# Patient Record
Sex: Female | Born: 1960
Health system: Southern US, Community
[De-identification: ages and names within clinical notes are randomized; demographics above are authoritative.]

## PROBLEM LIST (undated history)

## (undated) DIAGNOSIS — M1990 Unspecified osteoarthritis, unspecified site: Secondary | ICD-10-CM

## (undated) DIAGNOSIS — Z8614 Personal history of Methicillin resistant Staphylococcus aureus infection: Secondary | ICD-10-CM

## (undated) DIAGNOSIS — J40 Bronchitis, not specified as acute or chronic: Secondary | ICD-10-CM

## (undated) DIAGNOSIS — R519 Headache, unspecified: Secondary | ICD-10-CM

## (undated) DIAGNOSIS — R51 Headache: Secondary | ICD-10-CM

## (undated) DIAGNOSIS — I10 Essential (primary) hypertension: Secondary | ICD-10-CM

## (undated) DIAGNOSIS — K219 Gastro-esophageal reflux disease without esophagitis: Secondary | ICD-10-CM

## (undated) HISTORY — PX: TONSILLECTOMY: SUR1361

## (undated) HISTORY — PX: CHOLECYSTECTOMY: SHX55

---

## 2004-12-23 ENCOUNTER — Emergency Department: Payer: Self-pay | Admitting: Emergency Medicine

## 2005-01-09 ENCOUNTER — Emergency Department: Payer: Self-pay | Admitting: Emergency Medicine

## 2005-03-13 ENCOUNTER — Emergency Department: Payer: Self-pay | Admitting: Emergency Medicine

## 2005-04-05 ENCOUNTER — Emergency Department: Payer: Self-pay | Admitting: Emergency Medicine

## 2005-06-03 ENCOUNTER — Emergency Department: Payer: Self-pay | Admitting: Unknown Physician Specialty

## 2005-07-30 ENCOUNTER — Emergency Department: Payer: Self-pay | Admitting: Emergency Medicine

## 2005-07-31 ENCOUNTER — Ambulatory Visit: Payer: Self-pay | Admitting: Emergency Medicine

## 2005-12-10 ENCOUNTER — Emergency Department: Payer: Self-pay | Admitting: Emergency Medicine

## 2005-12-27 ENCOUNTER — Emergency Department: Payer: Self-pay | Admitting: Emergency Medicine

## 2006-02-14 ENCOUNTER — Emergency Department: Payer: Self-pay | Admitting: Internal Medicine

## 2007-09-13 ENCOUNTER — Emergency Department: Payer: Self-pay | Admitting: Emergency Medicine

## 2008-03-09 ENCOUNTER — Other Ambulatory Visit: Payer: Self-pay

## 2008-03-09 ENCOUNTER — Emergency Department: Payer: Self-pay | Admitting: Emergency Medicine

## 2008-09-22 IMAGING — CR DG ANKLE COMPLETE 3+V*L*
1 series · 4 of 4 positions shown · non-contrast
Comparison: none

REASON FOR EXAM: injury
COMMENTS:   LMP: Post-Menopausal

[Series 1: view not recorded · 0.17mm/px · 4 of 4 slices shown]
[im 1/4]
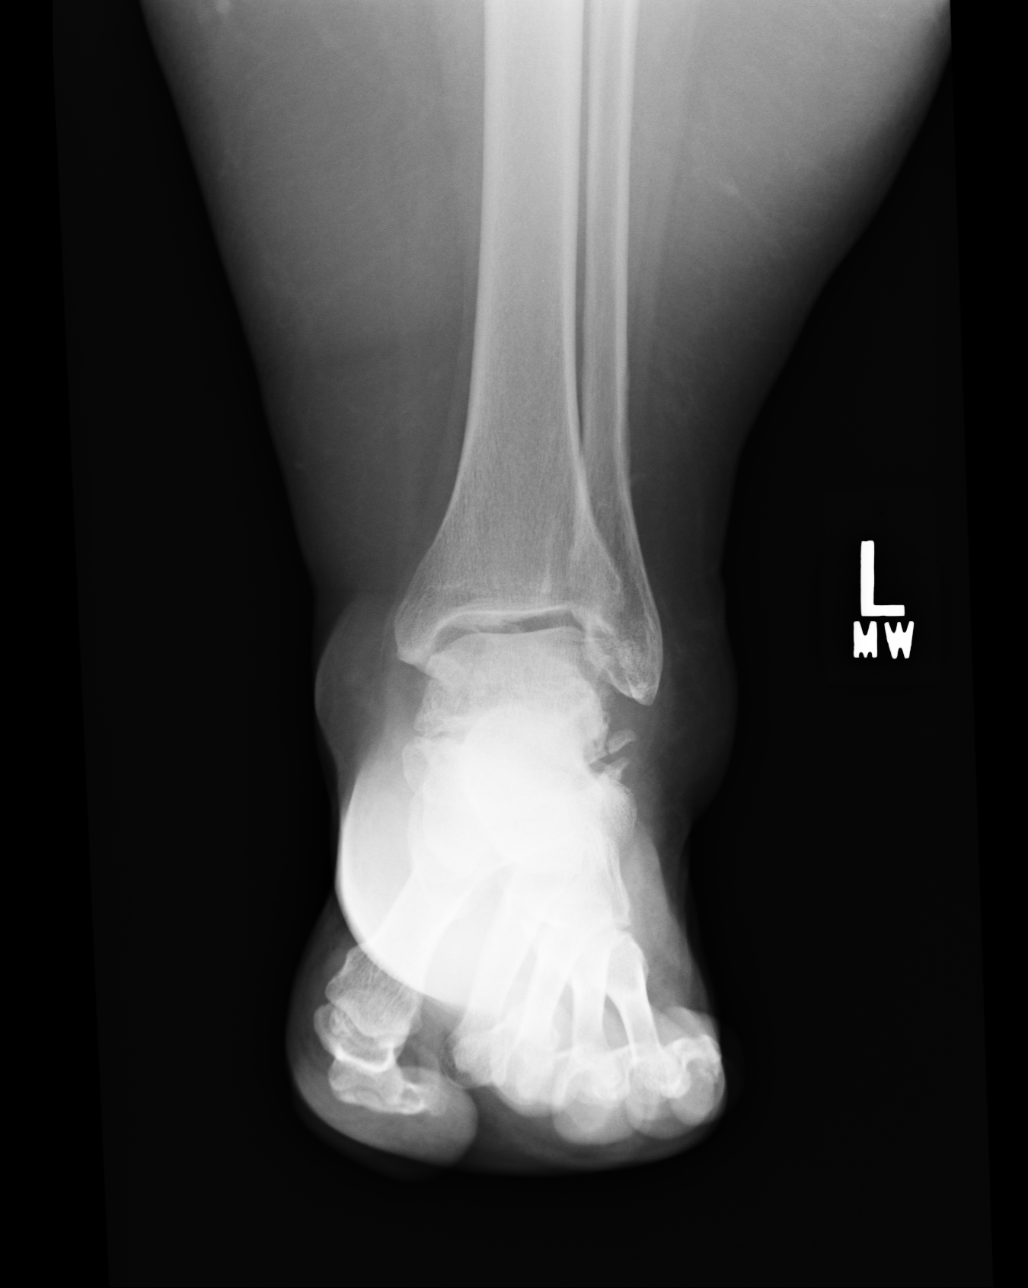
[im 2/4]
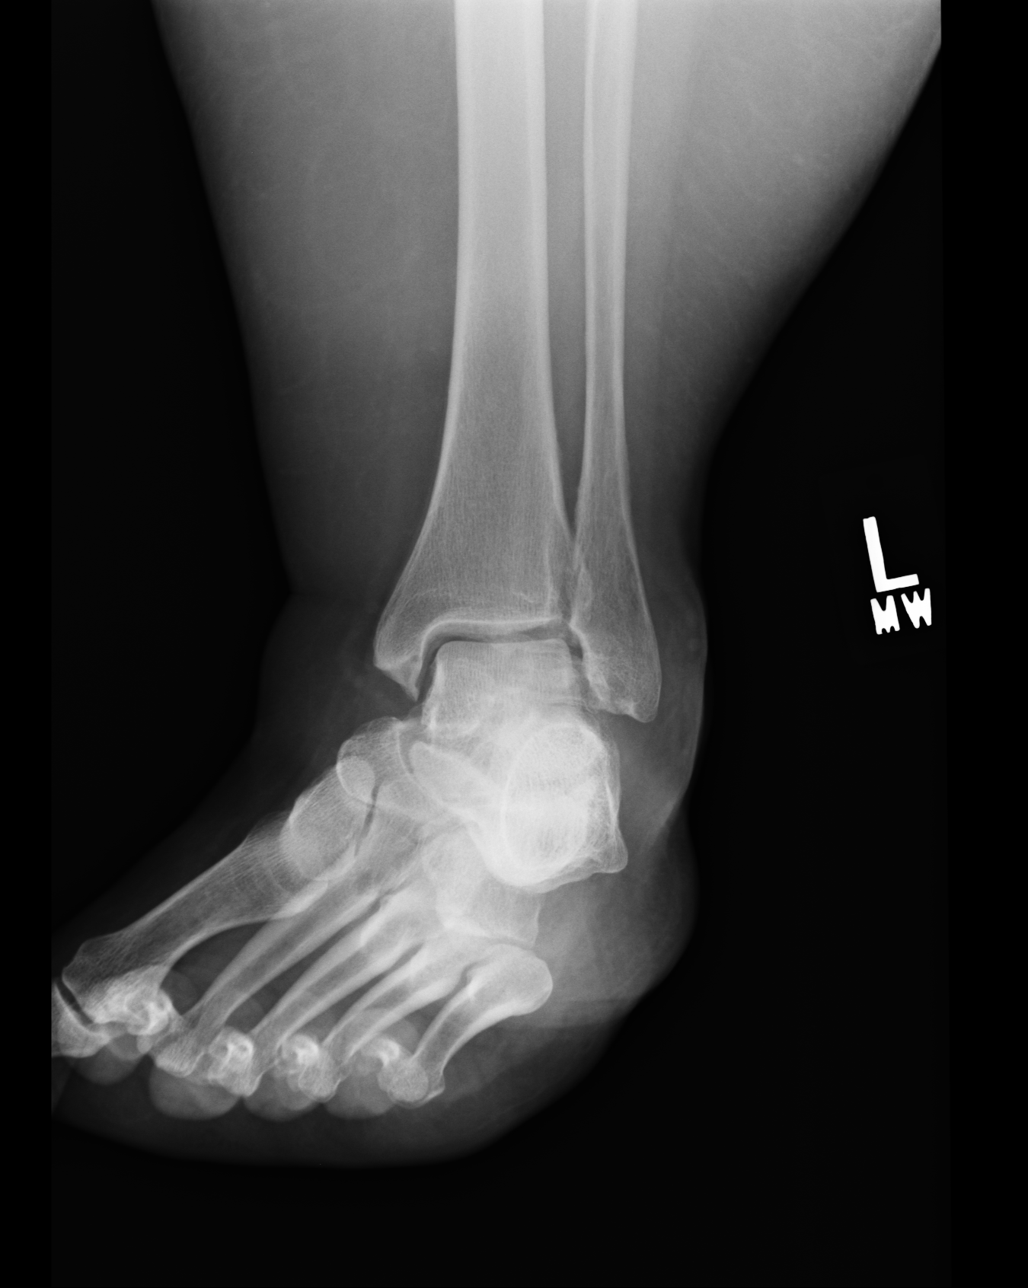
[im 3/4]
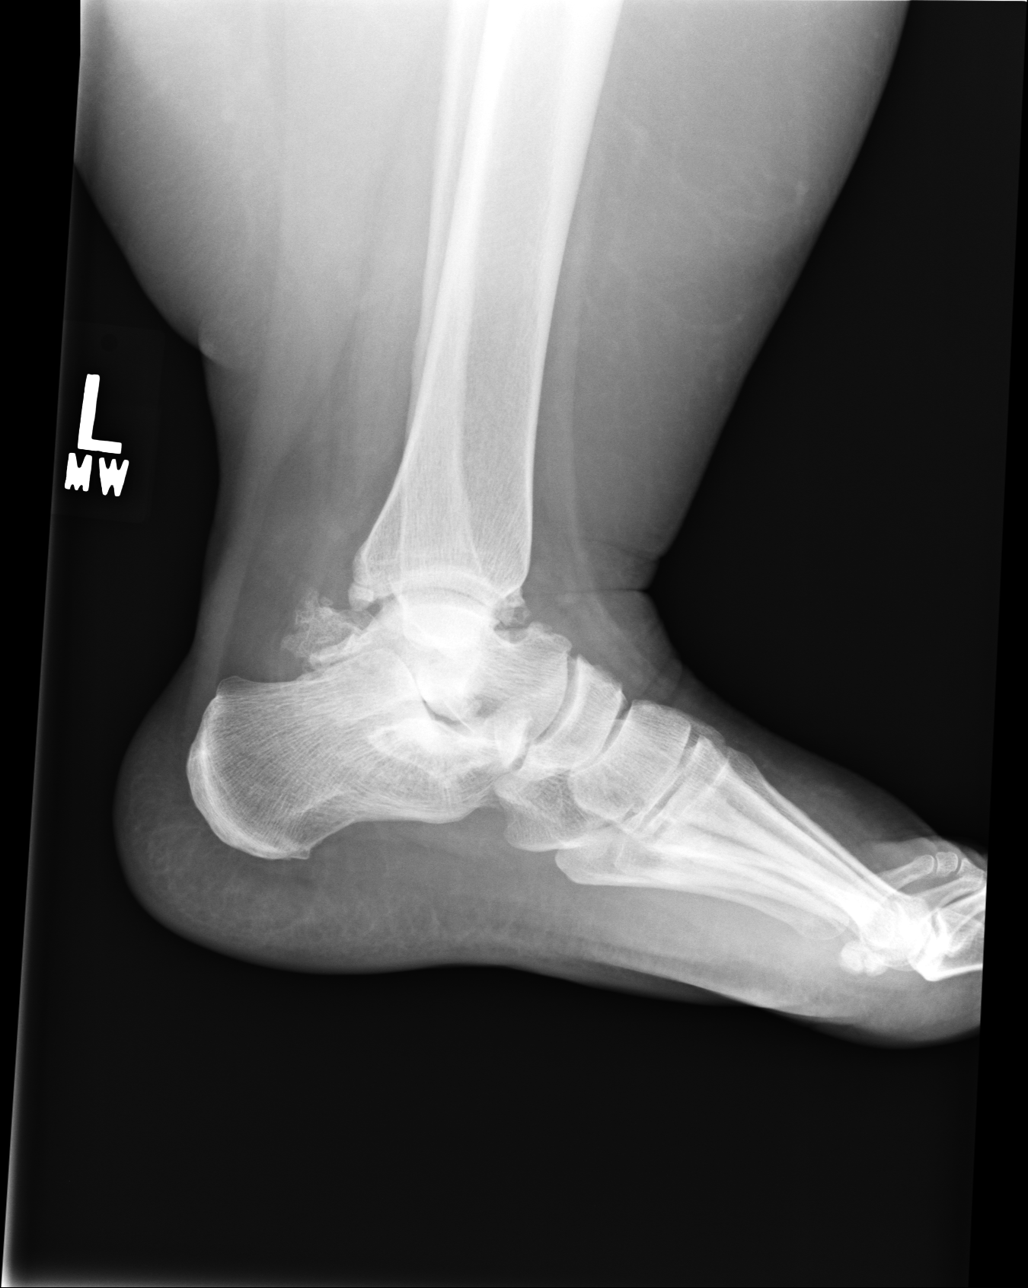
[im 4/4]
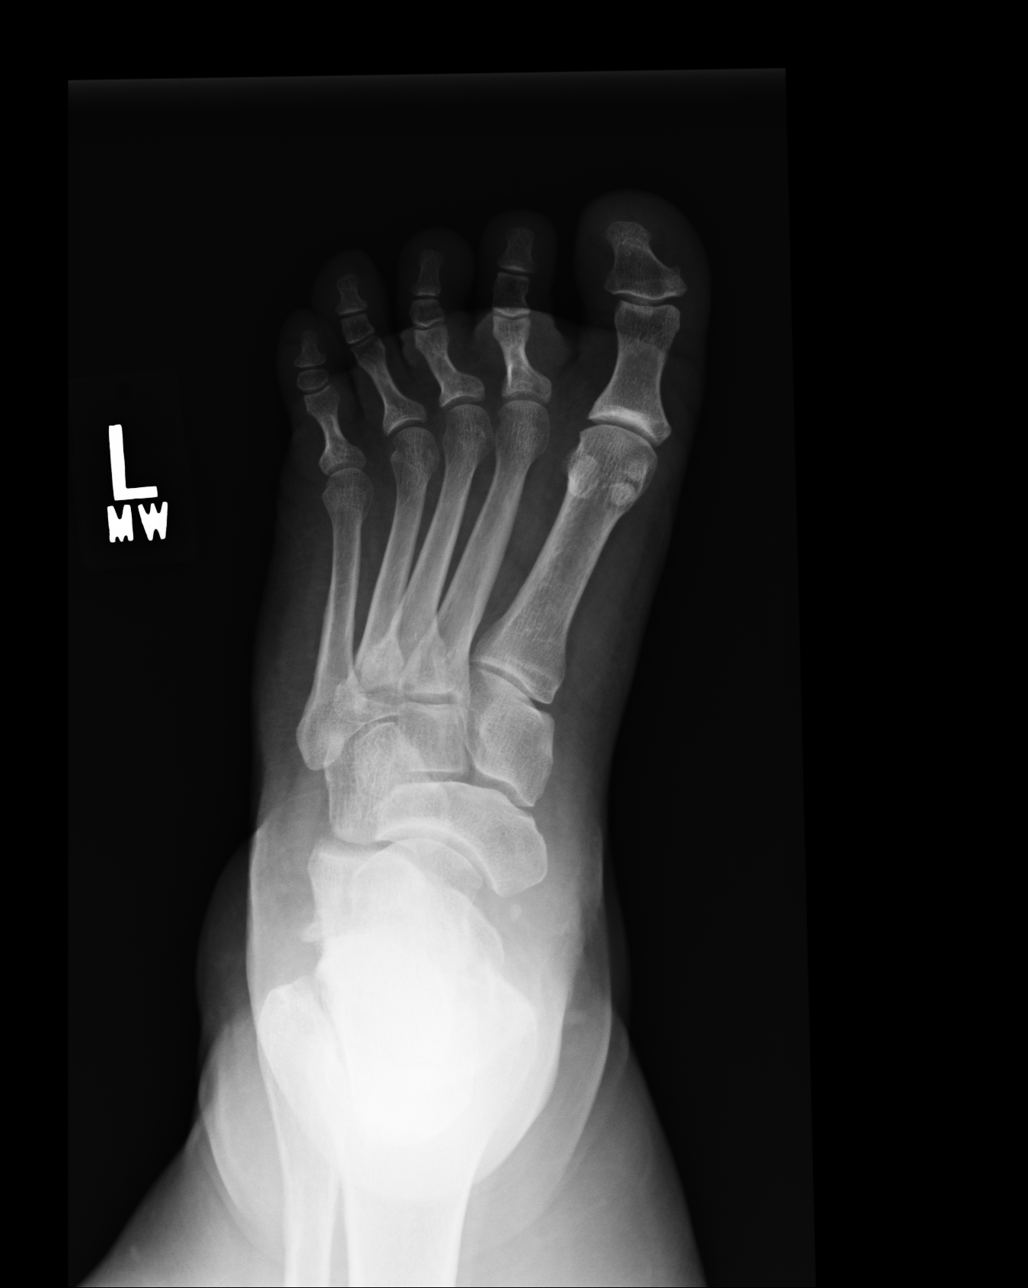

[4 of 4 positions shown; findings below may reference images not displayed]

PROCEDURE:     DXR - DXR ANKLE LEFT COMPLETE  - September 13, 2007  [DATE]

RESULT:     No acute fracture is identified. There is an osseous density
lateral to the inferior aspect of the talus consistent with a sesamoid bone
or an old chip fracture. Calcification is noted posterior to the ankle joint
and consistent with soft tissue calcification. There is irregularity of the
posterior malleolus of the distal tibia compatible with residual change from
prior trauma. Anterior spur formation is noted off the anterior aspect of
the tibia. The ankle mortise is well maintained.
IMPRESSION: 1.     No acute fracture is identified.

## 2009-07-05 ENCOUNTER — Ambulatory Visit: Payer: Self-pay | Admitting: Internal Medicine

## 2010-08-30 ENCOUNTER — Emergency Department: Payer: Self-pay | Admitting: Emergency Medicine

## 2010-12-20 DIAGNOSIS — Z8614 Personal history of Methicillin resistant Staphylococcus aureus infection: Secondary | ICD-10-CM

## 2010-12-20 HISTORY — DX: Personal history of Methicillin resistant Staphylococcus aureus infection: Z86.14

## 2011-02-09 ENCOUNTER — Ambulatory Visit: Payer: Self-pay | Admitting: Internal Medicine

## 2011-09-09 IMAGING — CR RIGHT FOOT - 2 VIEW
1 series · 2 of 2 positions shown · non-contrast
Comparison: none

REASON FOR EXAM: pain
COMMENTS:

[Series 1: view not recorded · 0.17mm/px · 2 of 2 slices shown]
[im 1/2]
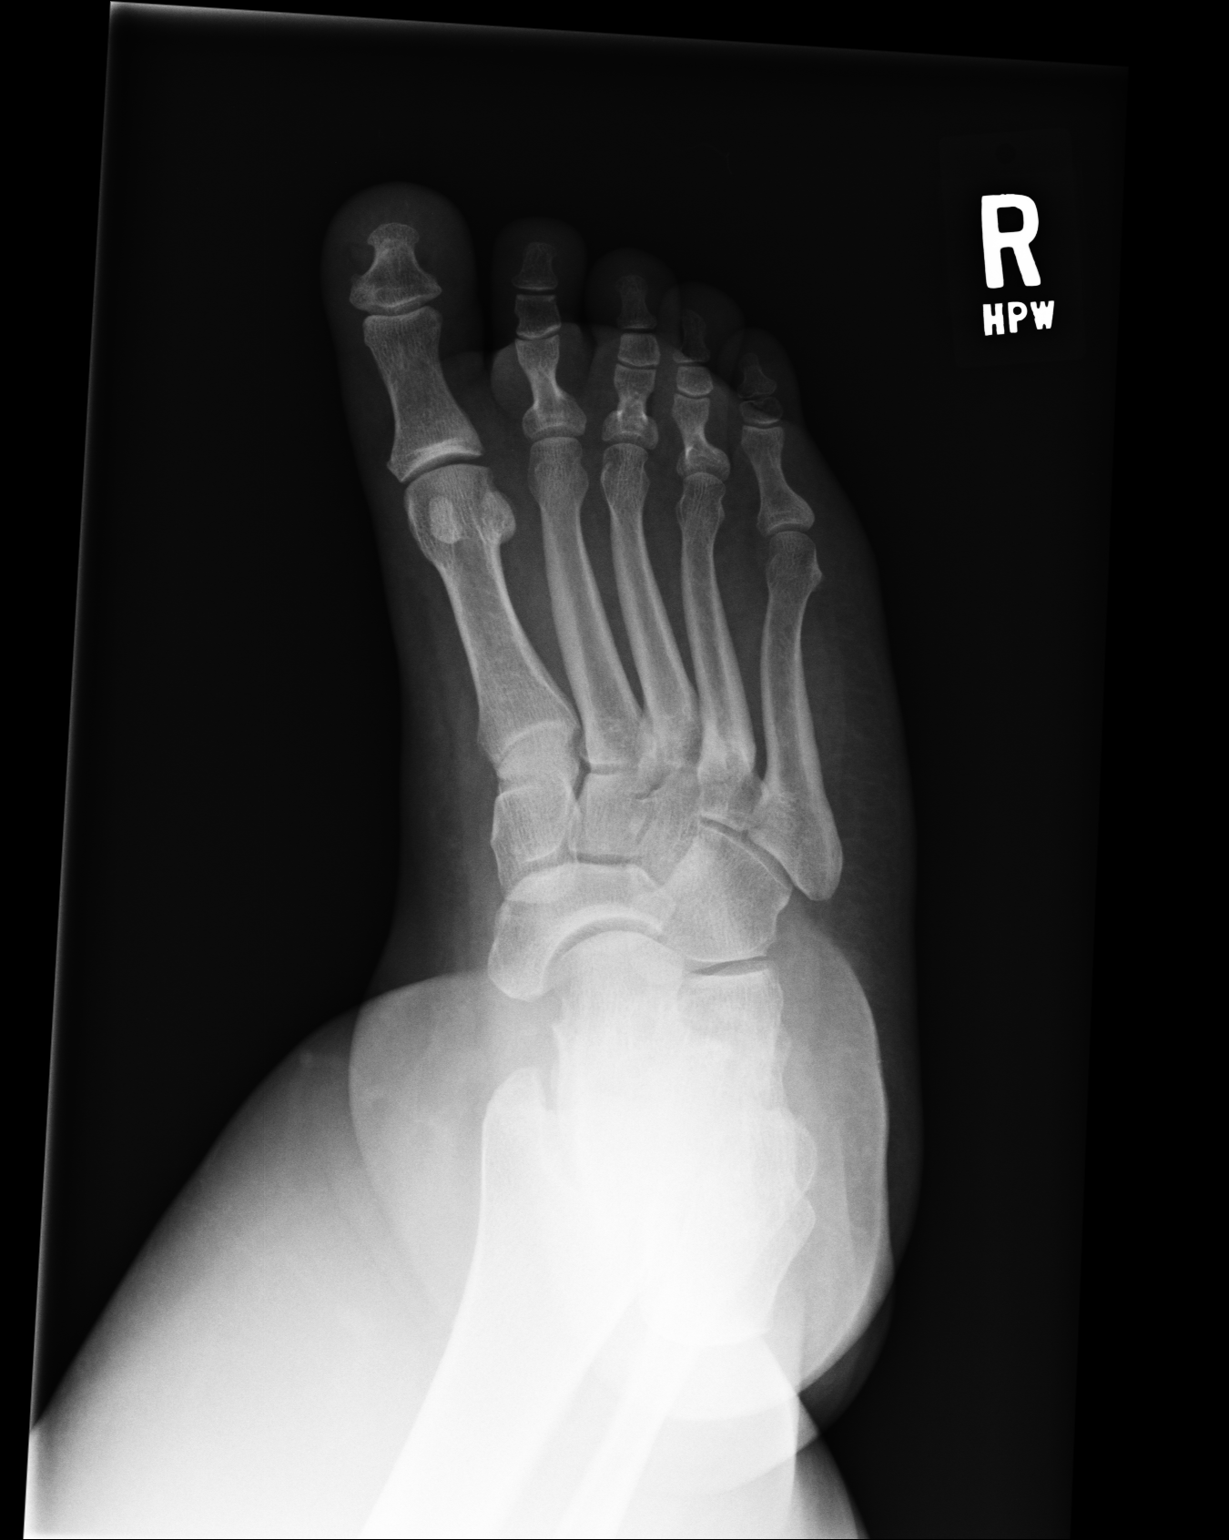
[im 2/2]
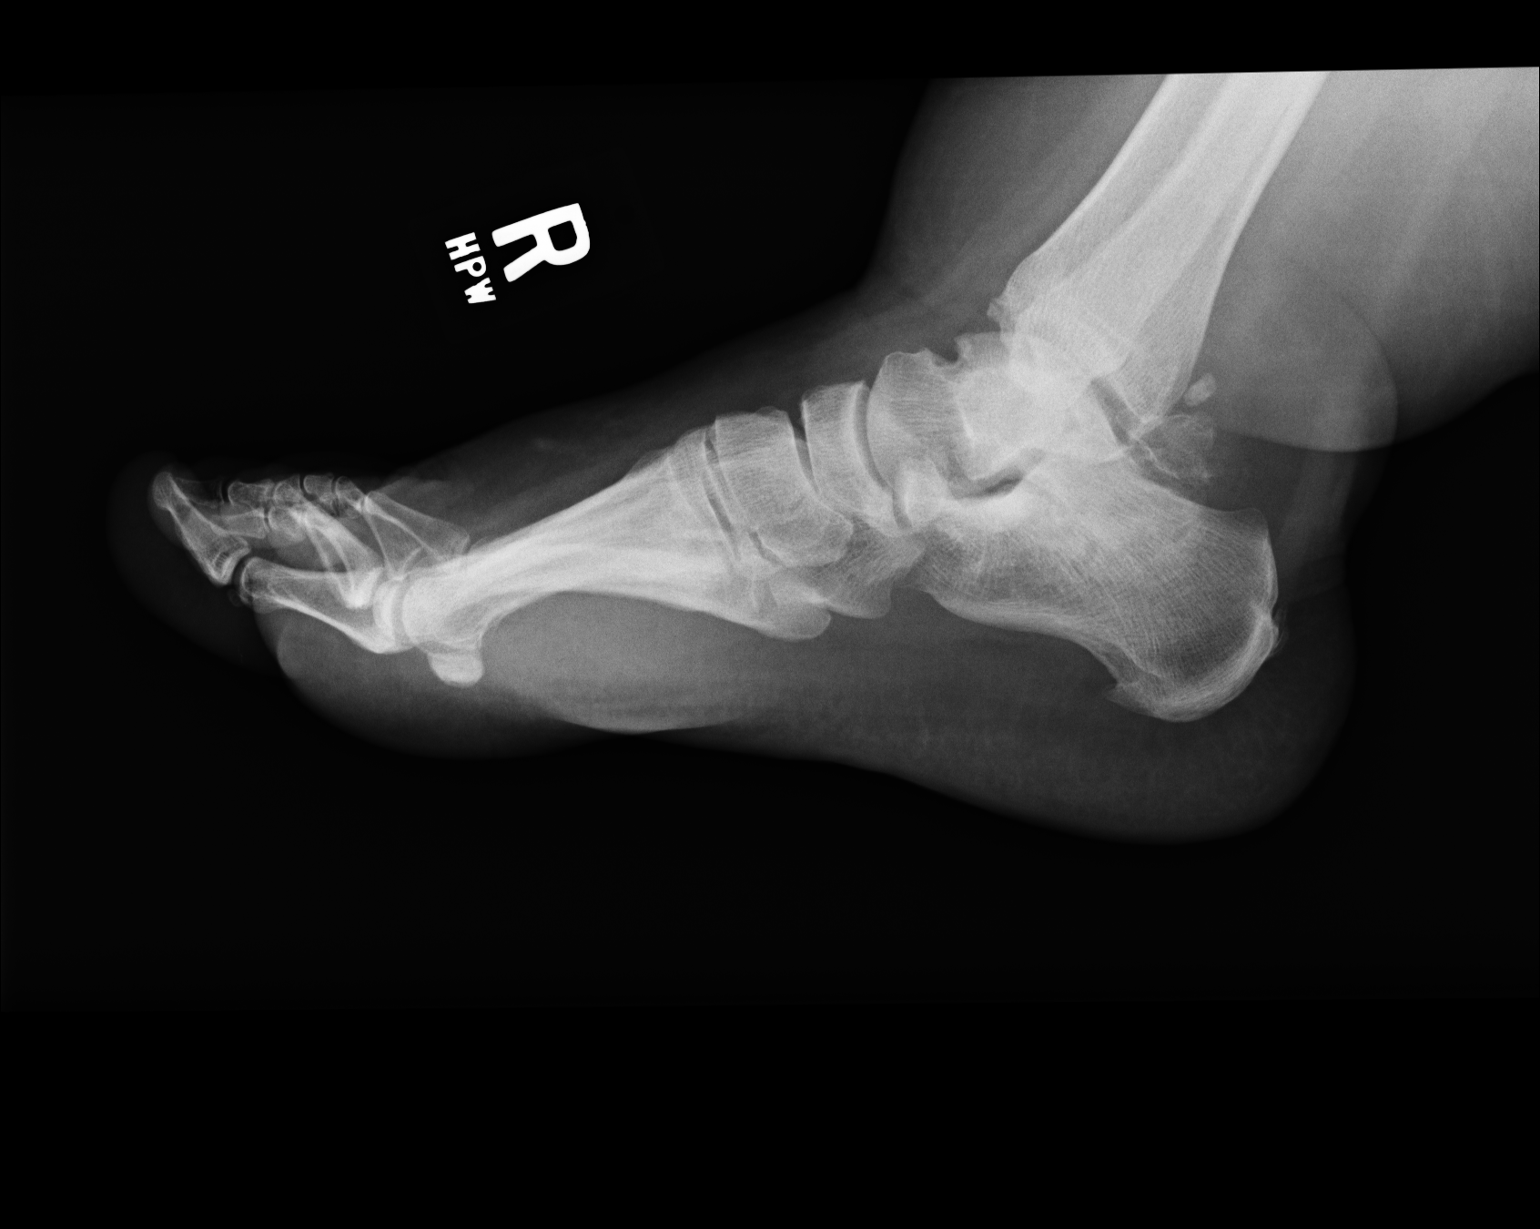

[2 of 2 positions shown; findings below may reference images not displayed]

PROCEDURE:     DXR - DXR FOOT RIGHT AP AND LATERAL  - August 30, 2010  [DATE]

RESULT:     AP and lateral views of the right foot reveal the bones to be
reasonably well mineralized. The phalanges and metatarsals appear intact.
There are chronic changes associated with the posterior aspect of the
tibiotalar joint which may be related to prior injury. I do not see evidence
of an acute fracture. There are Achilles region and plantar calcaneal spurs.
There does appear appear to be some swelling over the midfoot.
IMPRESSION: I do not see objective evidence of an acute fracture nor
healing fracture of the right foot. There are chronic changes associated
with the hindfoot. There is soft tissue swelling over the midfoot. Followup
CT imaging is available if there are strong clinical concerns of an occult
fracture. This is a limited two-view series.

## 2014-05-25 ENCOUNTER — Emergency Department: Payer: Self-pay | Admitting: Emergency Medicine

## 2015-06-04 IMAGING — CR DG FOOT COMPLETE 3+V*L*
1 series · 3 of 3 positions shown · non-contrast
Comparison: None.

CLINICAL DATA: Left foot pain and swelling.

EXAM:
LEFT FOOT - COMPLETE 3+ VIEW

[Series 1: x foot ap left · 0.14mm/px · 3 of 3 slices shown]
[im 1/3]
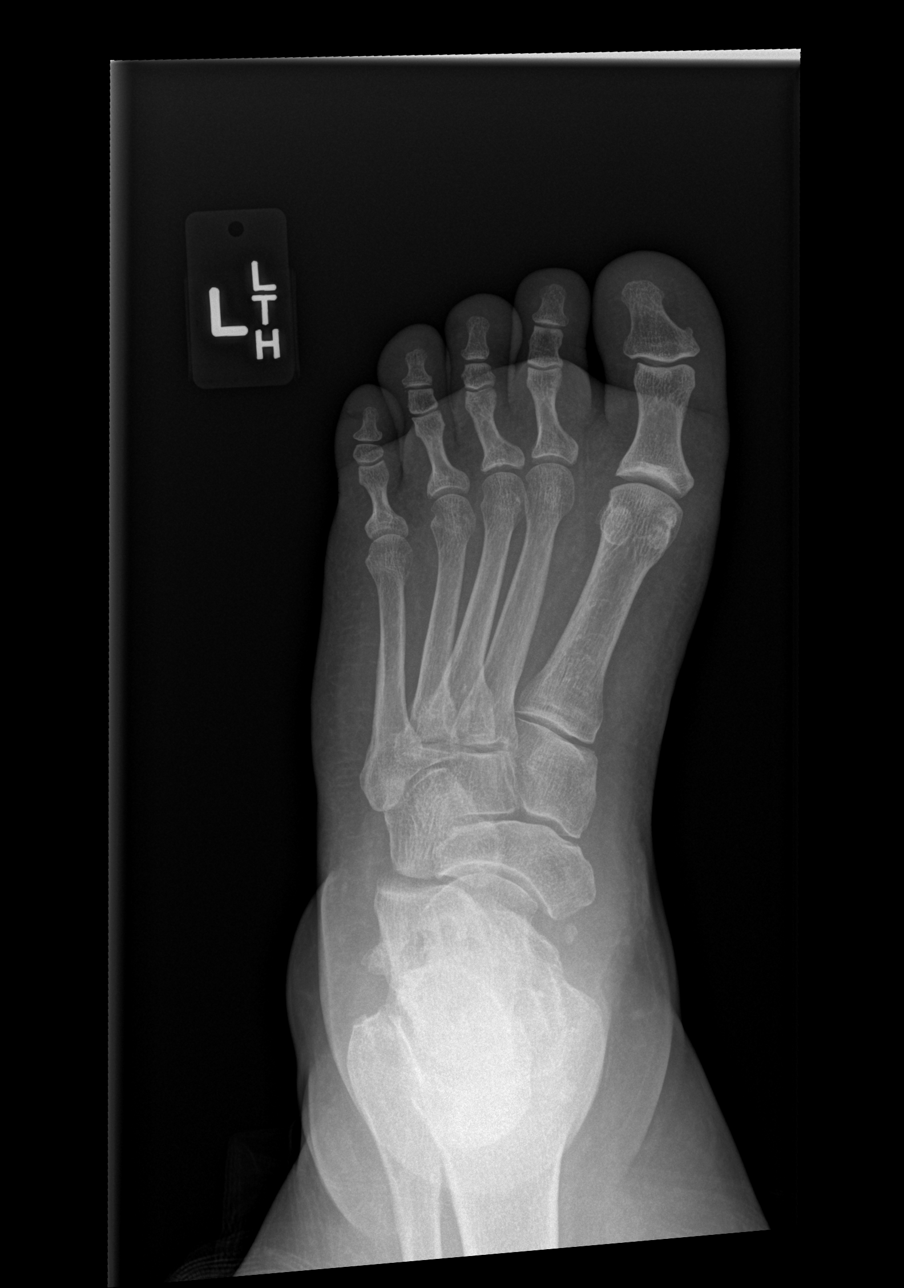
[im 2/3]
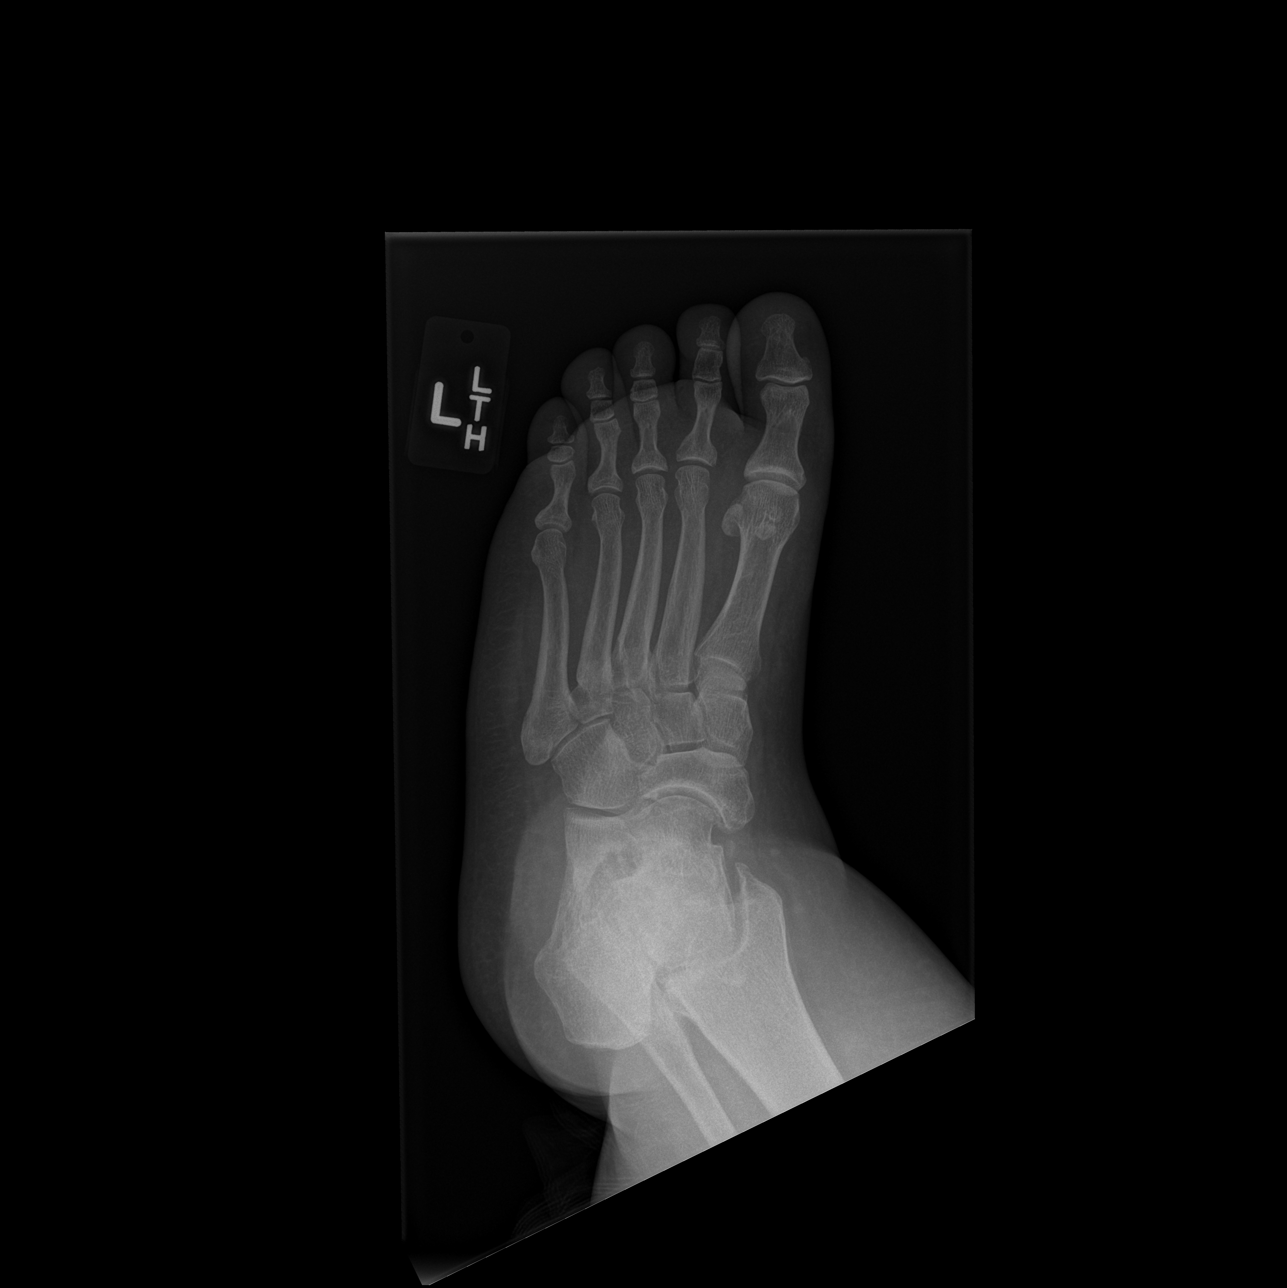
[im 3/3]
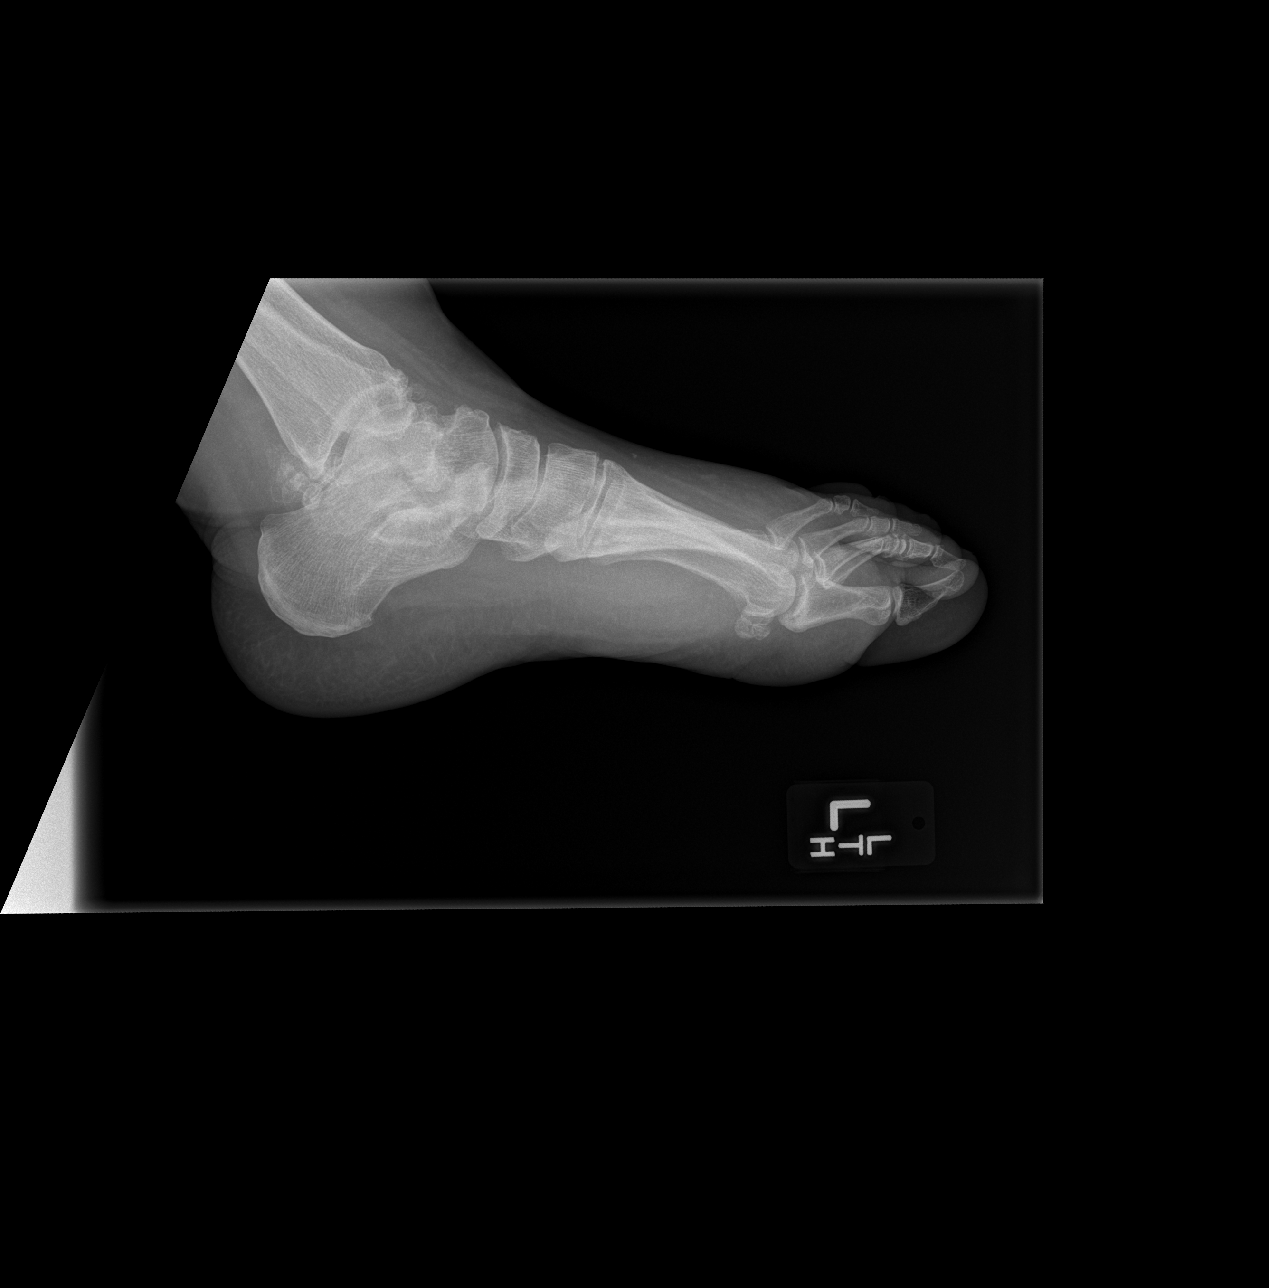

[3 of 3 positions shown; findings below may reference images not displayed]

FINDINGS: There is no evidence of fracture or dislocation. There is no
evidence of arthropathy or other focal bone abnormality in the foot.
Ankle joint osteoarthritis noted. Soft tissues are unremarkable.
IMPRESSION: No acute findings.

Ankle joint osteoarthritis noted.

## 2015-11-28 ENCOUNTER — Other Ambulatory Visit: Payer: Self-pay | Admitting: Internal Medicine

## 2015-11-28 DIAGNOSIS — Z1231 Encounter for screening mammogram for malignant neoplasm of breast: Secondary | ICD-10-CM

## 2015-12-11 ENCOUNTER — Ambulatory Visit: Payer: Self-pay | Attending: Internal Medicine

## 2016-01-19 ENCOUNTER — Emergency Department: Payer: Medicaid Other

## 2016-01-19 ENCOUNTER — Encounter: Payer: Self-pay | Admitting: Emergency Medicine

## 2016-01-19 ENCOUNTER — Emergency Department
Admission: EM | Admit: 2016-01-19 | Discharge: 2016-01-19 | Disposition: A | Discharge status: Home / Self Care | Payer: Medicaid Other | Attending: Emergency Medicine | Admitting: Emergency Medicine

## 2016-01-19 DIAGNOSIS — R079 Chest pain, unspecified: Secondary | ICD-10-CM | POA: Diagnosis not present

## 2016-01-19 DIAGNOSIS — I10 Essential (primary) hypertension: Secondary | ICD-10-CM | POA: Insufficient documentation

## 2016-01-19 DIAGNOSIS — F439 Reaction to severe stress, unspecified: Secondary | ICD-10-CM | POA: Insufficient documentation

## 2016-01-19 DIAGNOSIS — F419 Anxiety disorder, unspecified: Secondary | ICD-10-CM | POA: Diagnosis not present

## 2016-01-19 HISTORY — DX: Essential (primary) hypertension: I10

## 2016-01-19 LAB — TROPONIN I: TROPONIN I: 0.03 ng/mL (ref <0.031)

## 2016-01-19 LAB — BASIC METABOLIC PANEL
ANION GAP: 5 (ref 5–15)
BUN: 19 mg/dL (ref 6–20)
CHLORIDE: 101 mmol/L (ref 101–111)
CO2: 31 mmol/L (ref 22–32)
Calcium: 9 mg/dL (ref 8.9–10.3)
Creatinine, Ser: 0.72 mg/dL (ref 0.44–1.00)
GFR calc Af Amer: >60 mL/min (ref >60)
GFR calc non Af Amer: >60 mL/min (ref >60)
GLUCOSE: 108 mg/dL — AB (ref 65–99)
POTASSIUM: 3.9 mmol/L (ref 3.5–5.1)
Sodium: 137 mmol/L (ref 135–145)

## 2016-01-19 LAB — CBC
HEMATOCRIT: 42.8 % (ref 35.0–47.0)
HEMOGLOBIN: 14 g/dL (ref 12.0–16.0)
MCH: 27.5 pg (ref 26.0–34.0)
MCHC: 32.8 g/dL (ref 32.0–36.0)
MCV: 83.7 fL (ref 80.0–100.0)
Platelets: 263 10*3/uL (ref 150–440)
RBC: 5.11 MIL/uL (ref 3.80–5.20)
RDW: 14.3 % (ref 11.5–14.5)
WBC: 9.5 10*3/uL (ref 3.6–11.0)

## 2016-01-19 NOTE — ED Notes (Signed)
Pt had been having intermittent left sided cp since last night, occasionally shob, states she had a cardiac cath 6 months ago, no blockages at that time. Pt appears in no distress.

## 2016-01-19 NOTE — ED Notes (Signed)
MD Kinner at bedside  

## 2016-01-19 NOTE — ED Provider Notes (Signed)
[facility] Emergency Department Provider Note  ____________________________________________    I have reviewed the triage vital signs and the nursing notes.   HISTORY  Chief Complaint Chest Pain    HPI Anne Tucker is a 55 y.o. female who presents with complaints of intermittent left chest pain since last night. She reports it feels like a "pinching". She attributes this to stress that she is undergoing. No fevers or chills. She denies shortness of breath to me. She reports she was at [facility] 6 months ago and was cleared by the cardiologist and told her heart was normal. Currently she feels well and has no pain     Past Medical History  Diagnosis Date  . Hypertension     There are no active problems to display for this patient.   History reviewed. No pertinent past surgical history.  No current outpatient prescriptions on file.  Allergies Codeine  No family history on file.  Social History Social History  Substance Use Topics  . Smoking status: Never Smoker   . Smokeless tobacco: None  . Alcohol Use: No    Review of Systems  Constitutional: Negative for fever. Eyes: Negative for visual changes. ENT: Negative for sore throat Cardiovascular: As above Respiratory: Negative for shortness of breath. Gastrointestinal: Negative for abdominal pain, vomiting and diarrhea. Genitourinary: Negative for dysuria. Musculoskeletal: Negative for back pain. No calf pain or swelling Skin: Negative for rash. Neurological: Negative for headaches s Psychiatric: Positive for anxiety and stress    ____________________________________________   PHYSICAL EXAM:  VITAL SIGNS: ED Triage Vitals  Enc Vitals Group     BP 01/19/16 1532 125/72 mmHg     Pulse Rate 01/19/16 1532 91     Resp 01/19/16 1532 18     Temp 01/19/16 1532 98 F (36.7 C)     Temp Source 01/19/16 1532 Oral     SpO2 01/19/16 1532 98 %     Weight 01/19/16 1532 319 lb (144.697 kg)      Height 01/19/16 1532  (1.626 m)     Head Cir --      Peak Flow --      Pain Score 01/19/16 1539 5     Pain Loc --      Pain Edu? --      Excl. in GC? --      Constitutional: Alert and oriented. Well appearing and in no distress. Eyes: Conjunctivae are normal.  ENT   Head: Normocephalic and atraumatic.   Mouth/Throat: Mucous membranes are moist. Cardiovascular: Normal rate, regular rhythm. Normal and symmetric distal pulses are present in all extremities. No murmurs, rubs, or gallops. Respiratory: Normal respiratory effort without tachypnea nor retractions. Breath sounds are clear and equal bilaterally.  Gastrointestinal: Soft and non-tender in all quadrants. No distention. There is no CVA tenderness. Genitourinary: deferred Musculoskeletal: Nontender with normal range of motion in all extremities. No lower extremity tenderness nor edema. Neurologic:  Normal speech and language. No gross focal neurologic deficits are appreciated. Skin:  Skin is warm, dry and intact. No rash noted. Psychiatric: Mood and affect are normal. Patient exhibits appropriate insight and judgment.  ____________________________________________    LABS (pertinent positives/negatives)  Labs Reviewed  BASIC METABOLIC PANEL - Abnormal; Notable for the following:    Glucose, Bld 108 (*)    All other components within normal limits  CBC  TROPONIN I    ____________________________________________   EKG  ED ECG REPORT I, Jene Every, the attending physician, personally  viewed and interpreted this ECG.  Date: 01/19/2016 EKG Time: 3:28 PM Rate: 86 Rhythm: normal sinus rhythm QRS Axis: normal Intervals: normal ST/T Wave abnormalities: normal Conduction Disturbances: none Narrative Interpretation: unremarkable   ____________________________________________    RADIOLOGY I have personally reviewed any xrays that were ordered on this patient: Chest x-ray  unremarkable  ____________________________________________   PROCEDURES  Procedure(s) performed: none  Critical Care performed: none  ____________________________________________   INITIAL IMPRESSION / ASSESSMENT AND PLAN / ED COURSE  Pertinent labs & imaging results that were available during my care of the patient were reviewed by me and considered in my medical decision making (see chart for details).  Patient well-appearing and in no distress. Her EKG is unremarkable. Her troponin is normal. She is not having chest pain in the emergency department and has not had any pain for several hours. Apparently had a recent cardiac workup at [facility] which was unremarkable. Despite this I have requested that she follow-up with cardiology for further workup. She knows to return to the emergency department if she develops any chest pain  ____________________________________________   FINAL CLINICAL IMPRESSION(S) / ED DIAGNOSES  Final diagnoses:  Chest pain, unspecified chest pain type     Jene Every, MD 01/19/16 2142

## 2016-01-19 NOTE — Discharge Instructions (Signed)
Nonspecific Chest Pain °It is often hard to find the cause of chest pain. There is always a chance that your pain could be related to something serious, such as a heart attack or a blood clot in your lungs. Chest pain can also be caused by conditions that are not life-threatening. If you have chest pain, it is very important to follow up with your doctor. ° °HOME CARE °· If you were prescribed an antibiotic medicine, finish it all even if you start to feel better. °· Avoid any activities that cause chest pain. °· Do not use any tobacco products, including cigarettes, chewing tobacco, or electronic cigarettes. If you need help quitting, ask your doctor. °· Do not drink alcohol. °· Take medicines only as told by your doctor. °· Keep all follow-up visits as told by your doctor. This is important. This includes any further testing if your chest pain does not go away. °· Your doctor Evola tell you to keep your head raised (elevated) while you sleep. °· Make lifestyle changes as told by your doctor. These Lauricella include: °¨ Getting regular exercise. Ask your doctor to suggest some activities that are safe for you. °¨ Eating a heart-healthy diet. Your doctor or a diet specialist (dietitian) can help you to learn healthy eating options. °¨ Maintaining a healthy weight. °¨ Managing diabetes, if necessary. °¨ Reducing stress. °GET HELP IF: °· Your chest pain does not go away, even after treatment. °· You have a rash with blisters on your chest. °· You have a fever. °GET HELP RIGHT AWAY IF: °· Your chest pain is worse. °· You have an increasing cough, or you cough up blood. °· You have severe belly (abdominal) pain. °· You feel extremely weak. °· You pass out (faint). °· You have chills. °· You have sudden, unexplained chest discomfort. °· You have sudden, unexplained discomfort in your arms, back, neck, or jaw. °· You have shortness of breath at any time. °· You suddenly start to sweat, or your skin gets clammy. °· You feel  nauseous. °· You vomit. °· You suddenly feel light-headed or dizzy. °· Your heart begins to beat quickly, or it feels like it is skipping beats. °These symptoms Worrel be an emergency. Do not wait to see if the symptoms will go away. Get medical help right away. Call your local emergency services (911 in the [place] Do not drive yourself to the hospital. °  °This information is not intended to replace advice given to you by your health care provider. Make sure you discuss any questions you have with your health care provider. °  °Document Released: 05/24/2008 Document Revised: 12/27/2014 Document Reviewed: 07/12/2014 °Elsevier Interactive Patient Education ©2016 [employer] ° °

## 2016-10-20 ENCOUNTER — Other Ambulatory Visit: Payer: Self-pay | Admitting: Specialist

## 2016-10-22 ENCOUNTER — Encounter: Payer: Self-pay | Admitting: *Deleted

## 2016-10-22 ENCOUNTER — Encounter
Admission: RE | Admit: 2016-10-22 | Discharge: 2016-10-22 | Disposition: A | Discharge status: Home / Self Care | Payer: Medicaid Other | Source: Ambulatory Visit | Attending: Specialist | Admitting: Specialist

## 2016-10-22 HISTORY — DX: Headache, unspecified: R51.9

## 2016-10-22 HISTORY — DX: Headache: R51

## 2016-10-22 HISTORY — DX: Gastro-esophageal reflux disease without esophagitis: K21.9

## 2016-10-22 HISTORY — DX: Bronchitis, not specified as acute or chronic: J40

## 2016-10-22 HISTORY — DX: Unspecified osteoarthritis, unspecified site: M19.90

## 2016-10-22 NOTE — Patient Instructions (Addendum)
  Your procedure is scheduled on: 10-25-16  Report to Same Day Surgery 2nd floor medical mall To find out your arrival time please call [phone] between 1PM - 3PM on 10-22-16  Remember: Instructions that are not followed completely Milich result in serious medical risk, up to and including death, or upon the discretion of your surgeon and anesthesiologist your surgery Cervone need to be rescheduled.    _x___ 1. Do not eat food or drink liquids after midnight. No gum chewing or hard candies.     __x__ 2. No Alcohol for 24 hours before or after surgery.   __x__3. No Smoking for 24 prior to surgery.   ____  4. Bring all medications with you on the day of surgery if instructed.    __x__ 5. Notify your doctor if there is any change in your medical condition     (cold, fever, infections).     Do not wear jewelry, make-up, hairpins, clips or nail polish.  Do not wear lotions, powders, or perfumes. You Orrison wear deodorant.  Do not shave 48 hours prior to surgery. Men Costanzo shave face and neck.  Do not bring valuables to the hospital.    [facility] any belongings or valuables.               Contacts, dentures or bridgework Gielow not be worn into surgery.  Leave your suitcase in the car. After surgery it Carta be brought to your room.  For patients admitted to the hospital, discharge time is determined by your treatment team.   Patients discharged the day of surgery will not be allowed to drive home.    Please read over the following fact sheets that you were given:   [facility] Preparing [facility] and or MRSA Information   _x___ Take these medicines the morning of surgery with A SIP OF WATER:    1. LISINOPRIL  2. DULOXETINE  3. OMEPRAZOLE  4. TAKE AN EXTRA OMEPRAZOLE ON Sunday NIGHT BEFORE BED  5.  6.  ____Fleets enema or Magnesium Citrate as directed.   _x___ Use CHG Soap or sage wipes as directed on instruction sheet   _X___ Use inhalers on the day of surgery  and bring to hospital day of surgery-USE PROAIR INHALER AT HOME AND BRING TO HOSPITAL  ____ Stop metformin 2 days prior to surgery    ____ Take 1/2 of usual insulin dose the night before surgery and none on the morning of           surgery.   ____ Stop aspirin or coumadin, or plavix  x__ Stop Anti-inflammatories such as Advil, Aleve, Ibuprofen, Motrin, Naproxen,          Naprosyn, Goodies powders or aspirin products NOW-Ok to take Tylenol.   ____ Stop supplements until after surgery.    ____ Bring C-Pap to the hospital.

## 2016-10-22 NOTE — Pre-Procedure Instructions (Signed)
EKG  ED ECG REPORT I, Jene EveryKINNER, ROBERT, the attending physician, personally viewed and interpreted this ECG.  Date: 01/19/2016 EKG Time: 3:28 PM Rate: 86 Rhythm: normal sinus rhythm QRS Axis: normal Intervals: normal ST/T Wave abnormalities: normal Conduction Disturbances: none Narrative Interpretation: unremarkable

## 2016-10-24 MED ORDER — CLINDAMYCIN PHOSPHATE 900 MG/50ML IV SOLN
900.0000 mg | Freq: Once | INTRAVENOUS | Status: AC
  Administered 2016-10-25: 900 mg via INTRAVENOUS

## 2016-10-24 MED ORDER — CEFAZOLIN SODIUM-DEXTROSE 2-4 GM/100ML-% IV SOLN
2.0000 g | INTRAVENOUS | Status: AC
  Administered 2016-10-25: 2 g via INTRAVENOUS

## 2016-10-25 ENCOUNTER — Encounter: Payer: Self-pay | Admitting: *Deleted

## 2016-10-25 ENCOUNTER — Ambulatory Visit: Payer: Medicaid Other | Admitting: Anesthesiology

## 2016-10-25 ENCOUNTER — Ambulatory Visit
Admission: RE | Admit: 2016-10-25 | Discharge: 2016-10-25 | Disposition: A | Discharge status: Home / Self Care | Payer: Medicaid Other | Source: Ambulatory Visit | Attending: Specialist | Admitting: Specialist

## 2016-10-25 ENCOUNTER — Encounter
Admission: RE | Disposition: A | Discharge status: Home / Self Care | Payer: Self-pay | Source: Ambulatory Visit | Attending: Specialist

## 2016-10-25 DIAGNOSIS — Z79899 Other long term (current) drug therapy: Secondary | ICD-10-CM | POA: Diagnosis not present

## 2016-10-25 DIAGNOSIS — I10 Essential (primary) hypertension: Secondary | ICD-10-CM | POA: Diagnosis not present

## 2016-10-25 DIAGNOSIS — F17200 Nicotine dependence, unspecified, uncomplicated: Secondary | ICD-10-CM | POA: Insufficient documentation

## 2016-10-25 DIAGNOSIS — G5601 Carpal tunnel syndrome, right upper limb: Secondary | ICD-10-CM | POA: Insufficient documentation

## 2016-10-25 DIAGNOSIS — K219 Gastro-esophageal reflux disease without esophagitis: Secondary | ICD-10-CM | POA: Insufficient documentation

## 2016-10-25 DIAGNOSIS — R51 Headache: Secondary | ICD-10-CM | POA: Insufficient documentation

## 2016-10-25 HISTORY — DX: Personal history of Methicillin resistant Staphylococcus aureus infection: Z86.14

## 2016-10-25 HISTORY — PX: CARPAL TUNNEL RELEASE: SHX101

## 2016-10-25 LAB — BASIC METABOLIC PANEL
ANION GAP: 8 (ref 5–15)
BUN: 13 mg/dL (ref 6–20)
CHLORIDE: 102 mmol/L (ref 101–111)
CO2: 27 mmol/L (ref 22–32)
Calcium: 9.2 mg/dL (ref 8.9–10.3)
Creatinine, Ser: 0.57 mg/dL (ref 0.44–1.00)
GFR calc non Af Amer: >60 mL/min (ref >60)
Glucose, Bld: 105 mg/dL — ABNORMAL HIGH (ref 65–99)
POTASSIUM: 3.6 mmol/L (ref 3.5–5.1)
SODIUM: 137 mmol/L (ref 135–145)

## 2016-10-25 LAB — SURGICAL PCR SCREEN
MRSA, PCR: POSITIVE — AB
Staphylococcus aureus: POSITIVE — AB

## 2016-10-25 SURGERY — CARPAL TUNNEL RELEASE
Anesthesia: General | Laterality: Right

## 2016-10-25 MED ORDER — LIDOCAINE HCL (CARDIAC) 20 MG/ML IV SOLN
INTRAVENOUS | Status: DC | PRN
  Administered 2016-10-25: 100 mg via INTRAVENOUS

## 2016-10-25 MED ORDER — BUPIVACAINE HCL 0.5 % IJ SOLN
INTRAMUSCULAR | Status: DC | PRN
  Administered 2016-10-25: 19 mL

## 2016-10-25 MED ORDER — DEXAMETHASONE SODIUM PHOSPHATE 10 MG/ML IJ SOLN
INTRAMUSCULAR | Status: DC | PRN
  Administered 2016-10-25: 10 mg via INTRAVENOUS

## 2016-10-25 MED ORDER — BUPIVACAINE HCL (PF) 0.5 % IJ SOLN
INTRAMUSCULAR | Status: AC
  Filled 2016-10-25: qty 30

## 2016-10-25 MED ORDER — FENTANYL CITRATE (PF) 100 MCG/2ML IJ SOLN
INTRAMUSCULAR | Status: DC | PRN
  Administered 2016-10-25 (×2): 50 ug via INTRAVENOUS

## 2016-10-25 MED ORDER — MELOXICAM 7.5 MG PO TABS
15.0000 mg | ORAL_TABLET | Freq: Once | ORAL | Status: AC
  Administered 2016-10-25: 15 mg via ORAL

## 2016-10-25 MED ORDER — GABAPENTIN 300 MG PO CAPS
300.0000 mg | ORAL_CAPSULE | ORAL | Status: AC
Start: 2016-10-25 — End: 2016-10-25
  Administered 2016-10-25: 300 mg via ORAL

## 2016-10-25 MED ORDER — MIDAZOLAM HCL 2 MG/2ML IJ SOLN
INTRAMUSCULAR | Status: DC | PRN
  Administered 2016-10-25: 1 mg via INTRAVENOUS

## 2016-10-25 MED ORDER — MELOXICAM 7.5 MG PO TABS
ORAL_TABLET | ORAL | Status: AC
  Filled 2016-10-25: qty 2

## 2016-10-25 MED ORDER — CHLORHEXIDINE GLUCONATE CLOTH 2 % EX PADS: 6.0000 | MEDICATED_PAD | Freq: Once | CUTANEOUS | Status: DC

## 2016-10-25 MED ORDER — ONDANSETRON HCL 4 MG/2ML IJ SOLN
INTRAMUSCULAR | Status: DC | PRN
Start: 2016-10-25 — End: 2016-10-25
  Administered 2016-10-25: 4 mg via INTRAVENOUS

## 2016-10-25 MED ORDER — MELOXICAM 15 MG PO TABS: 15.0000 mg | ORAL_TABLET | Freq: Every day | ORAL | 3 refills | Status: AC

## 2016-10-25 MED ORDER — CLINDAMYCIN PHOSPHATE 900 MG/50ML IV SOLN
INTRAVENOUS | Status: AC
  Filled 2016-10-25: qty 50

## 2016-10-25 MED ORDER — CHLORHEXIDINE GLUCONATE CLOTH 2 % EX PADS
6.0000 | MEDICATED_PAD | Freq: Once | CUTANEOUS | Status: DC
Start: 2016-10-25 — End: 2016-10-25

## 2016-10-25 MED ORDER — PROPOFOL 10 MG/ML IV BOLUS
INTRAVENOUS | Status: DC | PRN
  Administered 2016-10-25: 50 mg via INTRAVENOUS
  Administered 2016-10-25: 200 mg via INTRAVENOUS

## 2016-10-25 MED ORDER — FENTANYL CITRATE (PF) 100 MCG/2ML IJ SOLN: 25.0000 ug | INTRAMUSCULAR | Status: DC | PRN

## 2016-10-25 MED ORDER — CEFAZOLIN SODIUM-DEXTROSE 2-4 GM/100ML-% IV SOLN
INTRAVENOUS | Status: AC
  Filled 2016-10-25: qty 100

## 2016-10-25 MED ORDER — SULFAMETHOXAZOLE-TRIMETHOPRIM 800-160 MG PO TABS: 1.0000 | ORAL_TABLET | Freq: Two times a day (BID) | ORAL | 3 refills | Status: AC

## 2016-10-25 MED ORDER — HYDROCODONE-ACETAMINOPHEN 7.5-325 MG PO TABS: 1.0000 | ORAL_TABLET | Freq: Four times a day (QID) | ORAL | 0 refills | Status: AC | PRN

## 2016-10-25 MED ORDER — GABAPENTIN 400 MG PO CAPS: 400.0000 mg | ORAL_CAPSULE | Freq: Two times a day (BID) | ORAL | 3 refills | Status: AC

## 2016-10-25 MED ORDER — ONDANSETRON HCL 4 MG/2ML IJ SOLN: 4.0000 mg | Freq: Once | INTRAMUSCULAR | Status: DC | PRN

## 2016-10-25 MED ORDER — LACTATED RINGERS IV SOLN
INTRAVENOUS | Status: DC
  Administered 2016-10-25: 13:00:00 via INTRAVENOUS

## 2016-10-25 MED ORDER — GABAPENTIN 300 MG PO CAPS
ORAL_CAPSULE | ORAL | Status: AC
  Filled 2016-10-25: qty 1

## 2016-10-25 SURGICAL SUPPLY — 26 items
BLADE SURG MINI STRL (BLADE) ×3 IMPLANT
BNDG ESMARK 4X12 TAN STRL LF (GAUZE/BANDAGES/DRESSINGS) ×3 IMPLANT
CANISTER SUCT 1200ML W/VALVE (MISCELLANEOUS) ×3 IMPLANT
CHLORAPREP W/TINT 26ML (MISCELLANEOUS) ×3 IMPLANT
CUFF TOURN 18 STER (MISCELLANEOUS) IMPLANT
ELECT REM PT RETURN 9FT ADLT (ELECTROSURGICAL) ×3
ELECTRODE REM PT RTRN 9FT ADLT (ELECTROSURGICAL) ×1 IMPLANT
GAUZE FLUFF 18X24 1PLY STRL (GAUZE/BANDAGES/DRESSINGS) ×3 IMPLANT
GAUZE PETRO XEROFOAM 1X8 (MISCELLANEOUS) ×3 IMPLANT
GLOVE BIO SURGEON STRL SZ8 (GLOVE) ×3 IMPLANT
GOWN STRL REUS W/ TWL LRG LVL3 (GOWN DISPOSABLE) ×1 IMPLANT
GOWN STRL REUS W/TWL LRG LVL3 (GOWN DISPOSABLE) ×2
GOWN STRL REUS W/TWL LRG LVL4 (GOWN DISPOSABLE) ×3 IMPLANT
KIT RM TURNOVER STRD PROC AR (KITS) ×3 IMPLANT
NS IRRIG 500ML POUR BTL (IV SOLUTION) ×3 IMPLANT
PACK EXTREMITY ARMC (MISCELLANEOUS) ×3 IMPLANT
PAD CAST CTTN 4X4 STRL (SOFTGOODS) ×1 IMPLANT
PAD PREP 24X41 OB/GYN DISP (PERSONAL CARE ITEMS) ×3 IMPLANT
PADDING CAST COTTON 4X4 STRL (SOFTGOODS) ×2
SPLINT CAST 1 STEP 3X12 (MISCELLANEOUS) ×3 IMPLANT
STOCKINETTE BIAS CUT 4 980044 (GAUZE/BANDAGES/DRESSINGS) ×3 IMPLANT
STOCKINETTE STRL 4IN 9604848 (GAUZE/BANDAGES/DRESSINGS) ×3 IMPLANT
SUT ETHILON 4-0 (SUTURE) ×2
SUT ETHILON 4-0 FS2 18XMFL BLK (SUTURE) ×1
SUT ETHILON 5-0 FS-2 18 BLK (SUTURE) ×3 IMPLANT
SUTURE ETHLN 4-0 FS2 18XMF BLK (SUTURE) ×1 IMPLANT

## 2016-10-25 NOTE — Transfer of Care (Signed)
Immediate Anesthesia Transfer of Care Note  Patient: Anne Tucker  Procedure(s) Performed: Procedure(s): CARPAL TUNNEL RELEASE (Right)  Patient Location: PACU  Anesthesia Type:General  Level of Consciousness: awake and alert   Airway & Oxygen Therapy: Patient Spontanous Breathing and Patient connected to face mask oxygen  Post-op Assessment: Report given to RN and Post -op Vital signs reviewed and stable  Post vital signs: Reviewed and stable  Last Vitals:  Vitals:   10/25/16 1139 10/25/16 1430  BP: (!) 146/86 130/84  Pulse: 91 (!) 114  Resp: 16 17  Temp: 36.4 C     Last Pain:  Vitals:   10/25/16 1139  TempSrc: Tympanic  PainSc: 4          Complications: No apparent anesthesia complications

## 2016-10-25 NOTE — Discharge Instructions (Signed)
AMBULATORY SURGERY  °DISCHARGE INSTRUCTIONS ° ° °1) The drugs that you were given will stay in your system until tomorrow so for the next 24 hours you should not: ° °A) Drive an automobile °B) Make any legal decisions °C) Drink any alcoholic beverage ° ° °2) You Mccarthy resume regular meals tomorrow.  Today it is better to start with liquids and gradually work up to solid foods. ° °You Salek eat anything you prefer, but it is better to start with liquids, then soup and crackers, and gradually work up to solid foods. ° ° °3) Please notify your doctor immediately if you have any unusual bleeding, trouble breathing, redness and pain at the surgery site, drainage, fever, or pain not relieved by medication. ° ° ° °4) Additional Instructions: ° ° ° ° ° ° ° °Please contact your physician with any problems or Same Day Surgery at [phone], Monday through Friday 6 am to 4 pm, or [place] at [place] Main number at [phone]. °

## 2016-10-25 NOTE — Op Note (Signed)
10/25/2016  2:23 PM  PATIENT:  Anne Tucker    PRE-OPERATIVE DIAGNOSIS: RIGHT CARPAL TUNNEL SYNDROME POST-OPERATIVE DIAGNOSIS: RIGHT CARPAL TUNNEL SYNDROME  PROCEDURE:  RIGHT CARPAL TUNNEL RELEASE  SURGEON: Valinda HoarHOWARD E Phylisha Dix, MD    ANESTHESIA:   General  TOURNIQUET TIME:  PREOPERATIVE INDICATIONS:  Anne Tucker is a  55 y.o. female with a diagnosis of right carpal tunnel syndrome who failed conservative measures and elected for surgical management.    The risks benefits and alternatives were discussed with the patient preoperatively including but not limited to the risks of infection, bleeding, nerve injury, incomplete relief of symptoms, pillar pain, cardiopulmonary complications, the need for revision surgery, among others, and the patient was willing to proceed.  OPERATIVE FINDINGS: Thickened volar ligament and nerve compression.  OPERATIVE PROCEDURE: The patient is brought to the operating room placed in the supine position. General anesthesia was administered. The right upper extremity was prepped and draped in usual sterile fashion. Time out was performed. The arm was elevated and exsanguinated and the tourniquet was inflated in the forearm due the necessity of placing her IV in the right antebrachial fossa. Incision was made in line with the radial border of the ring finger. The carpal tunnel transverse fascia was identified, cleaned, and incised sharply. The common sensory branches were visualized along with the superficial palmar arch and protected.  The median nerve was protected below  A Kelly clamp was  placed underneath the transverse carpal ligament, protecting the nerve. I released the ligament completely, and then released the proximal distal volar forearm fascia. The nerve was identified, and visualized, and protected throughout the case. The motor branch was intact upon inspection.  No masses or abnormalities were identified in ulnar bursa.  The wounds were irrigated copiously, and  the wounds injected with 1/2% sensorcaine, and the skin closed with nylon followed by a volar splint and sterile gauze.  Tourniquet was deflated with good return of blood flow to all fingers. Sponge and needle counts were correct.  The patient tolerated this well, with no complications. The patient was awakened and taken to recovery in good condition.

## 2016-10-25 NOTE — Anesthesia Procedure Notes (Signed)
Procedure Name: LMA Insertion Performed by: Erandy Mceachern Pre-anesthesia Checklist: Patient identified, Patient being monitored, Timeout performed, Emergency Drugs available and Suction available Patient Re-evaluated:Patient Re-evaluated prior to inductionOxygen Delivery Method: Circle system utilized Preoxygenation: Pre-oxygenation with 100% oxygen Intubation Type: IV induction LMA: LMA inserted LMA Size: 4.0 Tube type: Oral Number of attempts: 1 Placement Confirmation: positive ETCO2 and breath sounds checked- equal and bilateral Tube secured with: Tape Dental Injury: Teeth and Oropharynx as per pre-operative assessment        

## 2016-10-25 NOTE — Anesthesia Preprocedure Evaluation (Signed)
Anesthesia Evaluation  Patient identified by MRN, date of birth, ID band Patient awake    Reviewed: Allergy & Precautions, H&P , NPO status , Patient's Chart, lab work & pertinent test results, reviewed documented beta blocker date and time   Airway Mallampati: III  TM Distance: >3 FB Neck ROM: full    Dental  (+) Teeth Intact, Poor Dentition   Pulmonary neg pulmonary ROS, Current Smoker,    Pulmonary exam normal        Cardiovascular Exercise Tolerance: Poor hypertension, (-) angina(-) CHF, (-) Orthopnea, (-) PND and (-) DOE negative cardio ROS Normal cardiovascular exam(-) dysrhythmias  Rate:Normal     Neuro/Psych  Headaches, negative neurological ROS  negative psych ROS   GI/Hepatic negative GI ROS, Neg liver ROS, GERD  Medicated,  Endo/Other  negative endocrine ROS  Renal/GU negative Renal ROS  negative genitourinary   Musculoskeletal   Abdominal   Peds  Hematology negative hematology ROS (+)   Anesthesia Other Findings   Reproductive/Obstetrics negative OB ROS                             Anesthesia Physical Anesthesia Plan  ASA: III  Anesthesia Plan: General LMA   Post-op Pain Management:    Induction:   Airway Management Planned:   Additional Equipment:   Intra-op Plan:   Post-operative Plan:   Informed Consent: I have reviewed the patients History and Physical, chart, labs and discussed the procedure including the risks, benefits and alternatives for the proposed anesthesia with the patient or authorized representative who has indicated his/her understanding and acceptance.     Plan Discussed with: CRNA  Anesthesia Plan Comments:         Anesthesia Quick Evaluation

## 2016-10-25 NOTE — H&P (Signed)
THE PATIENT WAS SEEN PRIOR TO SURGERY TODAY.  HISTORY, ALLERGIES, HOME MEDICATIONS AND OPERATIVE PROCEDURE WERE REVIEWED. RISKS AND BENEFITS OF SURGERY DISCUSSED WITH PATIENT AGAIN.  NO CHANGES FROM INITIAL HISTORY AND PHYSICAL NOTED.    

## 2016-10-25 NOTE — OR Nursing (Signed)
Dr. Hyacinth MeekerMiller notified of difficulty placing the IV. RN received permission from MD on the phone to place IV on operative arm.

## 2016-10-25 NOTE — OR Nursing (Signed)
PACU notified of postive PCR screen and to relay results to Dr Hyacinth MeekerMiller when pt transferred to PACU,

## 2016-10-27 ENCOUNTER — Encounter: Payer: Self-pay | Admitting: Specialist

## 2016-10-29 NOTE — Anesthesia Postprocedure Evaluation (Signed)
Anesthesia Post Note  Patient: Anne Tucker  Procedure(s) Performed: Procedure(s) (LRB): CARPAL TUNNEL RELEASE (Right)  Patient location during evaluation: PACU Anesthesia Type: General Level of consciousness: awake and alert Pain management: pain level controlled Vital Signs Assessment: post-procedure vital signs reviewed and stable Respiratory status: spontaneous breathing, nonlabored ventilation, respiratory function stable and patient connected to nasal cannula oxygen Cardiovascular status: blood pressure returned to baseline and stable Postop Assessment: no signs of nausea or vomiting Anesthetic complications: no    Last Vitals:  Vitals:   10/25/16 1533 10/25/16 1539  BP: 125/76 (!) 143/81  Pulse: 93   Resp: 20   Temp: 37 C     Last Pain:  Vitals:   10/25/16 1533  TempSrc:   PainSc: 0-No pain                 Yevette EdwardsJames G Veniamin Kincaid

## 2017-01-28 IMAGING — CR DG CHEST 2V
2 series · 2 of 2 positions shown · non-contrast
Comparison: 01/19/2016

CLINICAL DATA: Left-sided chest pain, right arm pain for 2 days

EXAM:
CHEST  2 VIEW

[chest pa]
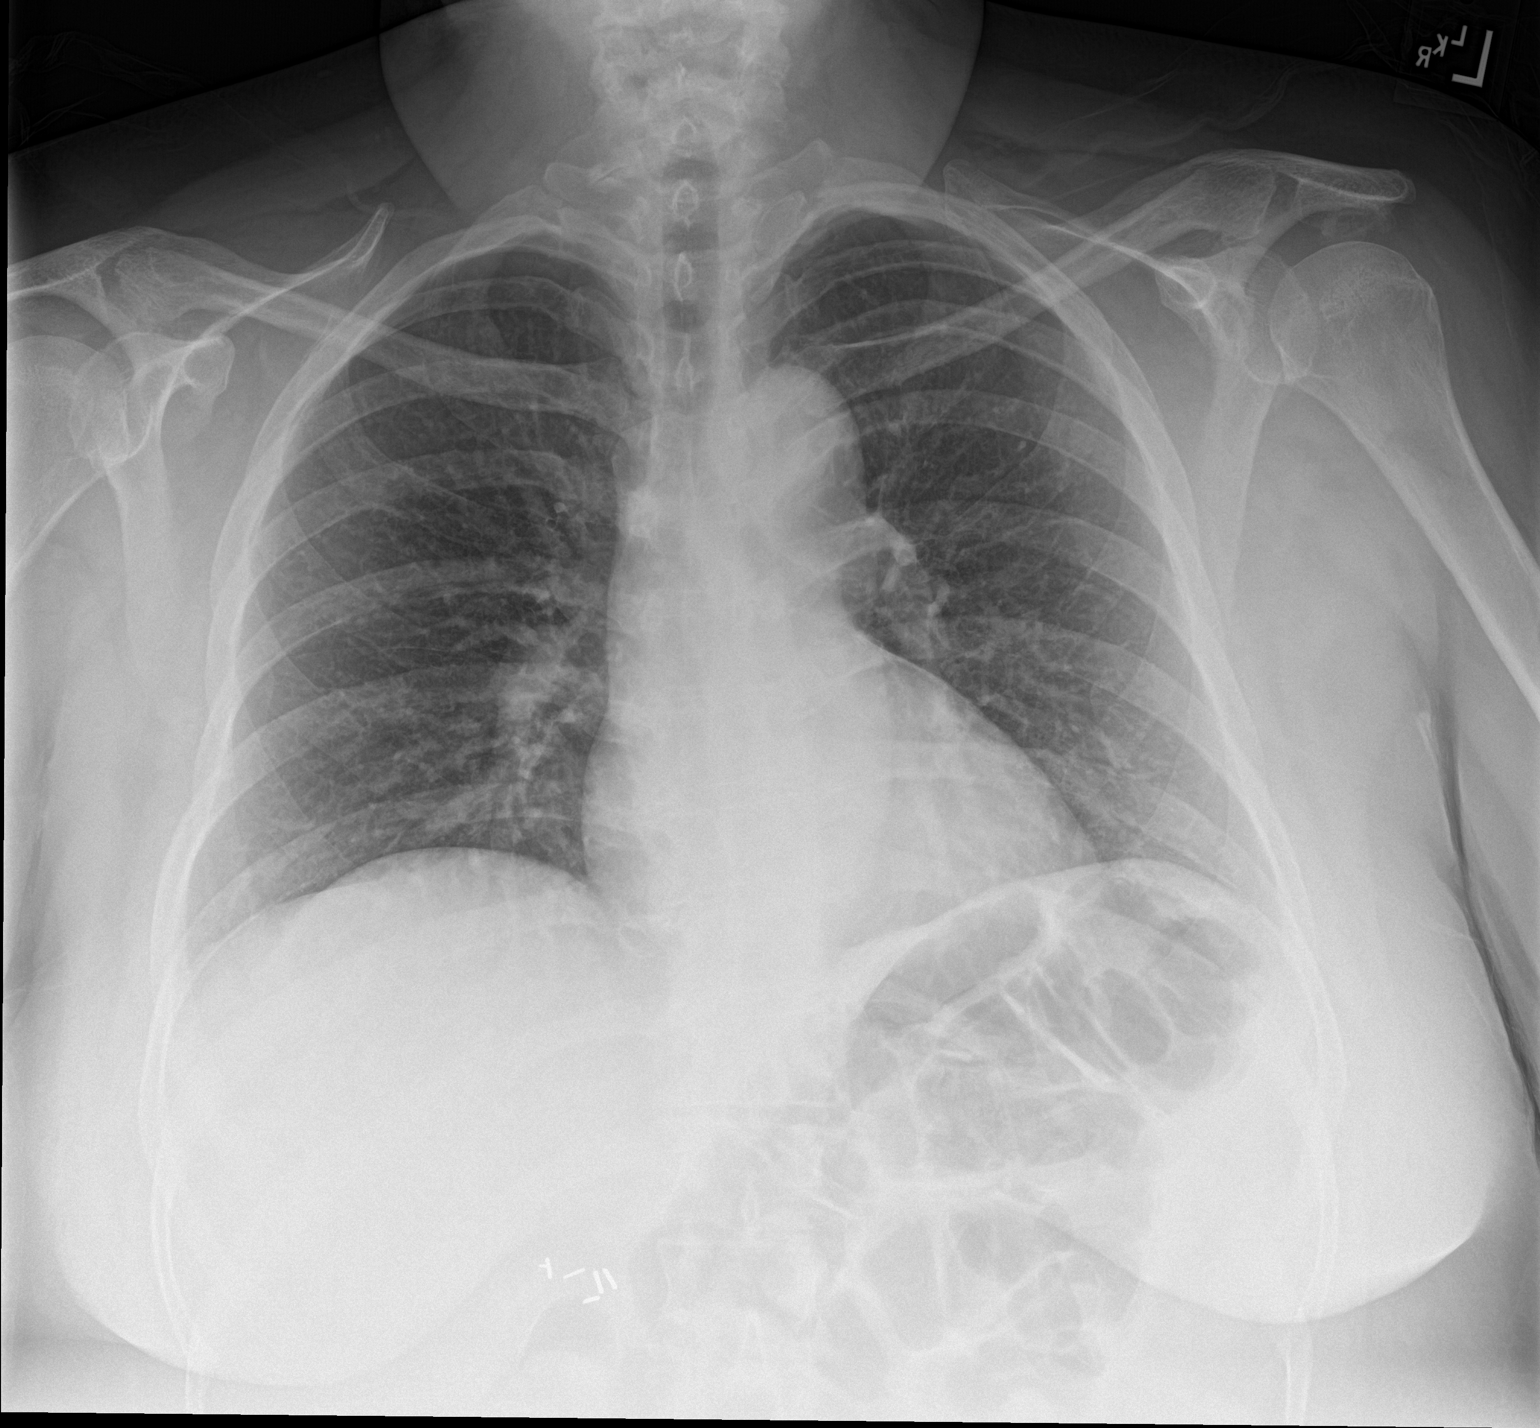

[chest lat]
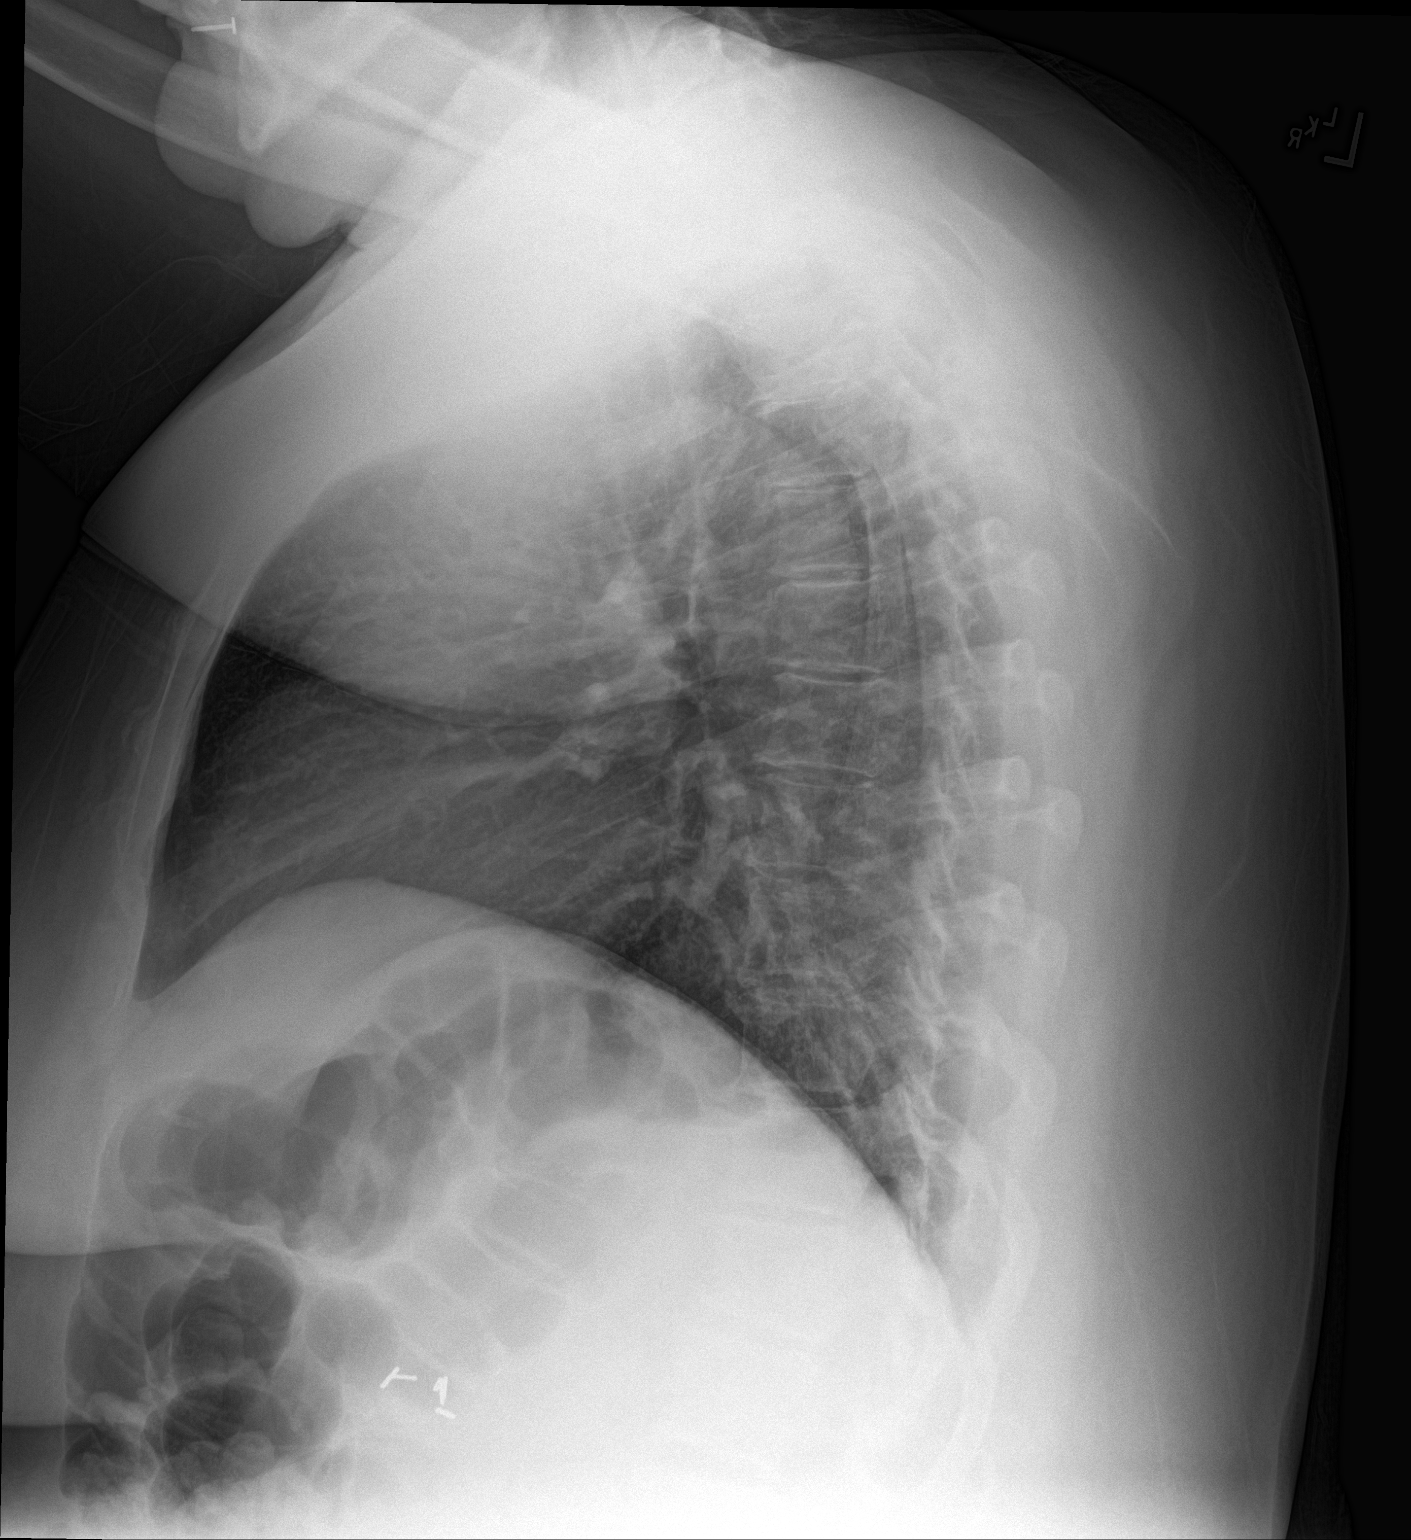

[2 of 2 positions shown; findings below may reference images not displayed]

FINDINGS: The heart size and mediastinal contours are within normal limits.
Both lungs are clear. The visualized skeletal structures are
unremarkable.
IMPRESSION: No active cardiopulmonary disease.

## 2020-03-20 ENCOUNTER — Encounter: Payer: Self-pay | Admitting: *Deleted

## 2020-03-20 ENCOUNTER — Other Ambulatory Visit: Payer: Self-pay

## 2020-03-20 DIAGNOSIS — R109 Unspecified abdominal pain: Secondary | ICD-10-CM | POA: Insufficient documentation

## 2020-03-20 DIAGNOSIS — Z5321 Procedure and treatment not carried out due to patient leaving prior to being seen by health care provider: Secondary | ICD-10-CM | POA: Diagnosis not present

## 2020-03-20 LAB — CBC
HCT: 50.1 % — ABNORMAL HIGH (ref 36.0–46.0)
Hemoglobin: 16.3 g/dL — ABNORMAL HIGH (ref 12.0–15.0)
MCH: 29.1 pg (ref 26.0–34.0)
MCHC: 32.5 g/dL (ref 30.0–36.0)
MCV: 89.3 fL (ref 80.0–100.0)
Platelets: 243 10*3/uL (ref 150–400)
RBC: 5.61 MIL/uL — ABNORMAL HIGH (ref 3.87–5.11)
RDW: 12.4 % (ref 11.5–15.5)
WBC: 11.7 10*3/uL — ABNORMAL HIGH (ref 4.0–10.5)
nRBC: 0 % (ref 0.0–0.2)

## 2020-03-20 LAB — COMPREHENSIVE METABOLIC PANEL
ALT: 36 U/L (ref 0–44)
AST: 42 U/L — ABNORMAL HIGH (ref 15–41)
Albumin: 4.1 g/dL (ref 3.5–5.0)
Alkaline Phosphatase: 79 U/L (ref 38–126)
Anion gap: 12 (ref 5–15)
BUN: 14 mg/dL (ref 6–20)
CO2: 27 mmol/L (ref 22–32)
Calcium: 9.4 mg/dL (ref 8.9–10.3)
Chloride: 98 mmol/L (ref 98–111)
Creatinine, Ser: 0.76 mg/dL (ref 0.44–1.00)
GFR calc Af Amer: >60 mL/min (ref >60)
GFR calc non Af Amer: >60 mL/min (ref >60)
Glucose, Bld: 200 mg/dL — ABNORMAL HIGH (ref 70–99)
Potassium: 3.9 mmol/L (ref 3.5–5.1)
Sodium: 137 mmol/L (ref 135–145)
Total Bilirubin: 0.8 mg/dL (ref 0.3–1.2)
Total Protein: 8.3 g/dL — ABNORMAL HIGH (ref 6.5–8.1)

## 2020-03-20 LAB — LIPASE, BLOOD: Lipase: 19 U/L (ref 11–51)

## 2020-03-20 NOTE — ED Notes (Signed)
Unable to void at this time.

## 2020-03-20 NOTE — ED Triage Notes (Signed)
Pt to triage via wheelchair.  Pt has abd pain  Pt reports nausea and diarrhea for 4 days.  No vomiting.

## 2020-03-21 ENCOUNTER — Emergency Department
Admission: EM | Admit: 2020-03-21 | Discharge: 2020-03-21 | Disposition: A | Discharge status: Home / Self Care | Payer: Medicaid Other | Attending: Emergency Medicine | Admitting: Emergency Medicine

## 2020-03-21 NOTE — ED Notes (Signed)
Patient called, no answer. Patient not seen in lobby, bathroom, or outside.  

## 2020-03-21 NOTE — ED Notes (Signed)
Patient called, no answer. Not seen in lobby.  

## 2020-03-21 NOTE — ED Notes (Signed)
Called, no answer. Patient not seen in lobby, bathroom, or outside.  

## 2020-03-21 NOTE — ED Notes (Signed)
Patient called, no answer; not seen in lobby.  ° °

## 2022-10-15 ENCOUNTER — Ambulatory Visit: Payer: Medicaid Other | Admitting: Nurse Practitioner
